# Patient Record
Sex: Male | Born: 1940 | Race: Black or African American | Hispanic: No | Smoking: Never smoker
Health system: Southern US, Community
[De-identification: ages and names within clinical notes are randomized; demographics above are authoritative.]

## PROBLEM LIST (undated history)

## (undated) DIAGNOSIS — M549 Dorsalgia, unspecified: Secondary | ICD-10-CM

## (undated) DIAGNOSIS — G8929 Other chronic pain: Secondary | ICD-10-CM

## (undated) DIAGNOSIS — E7800 Pure hypercholesterolemia, unspecified: Secondary | ICD-10-CM

---

## 2016-01-04 ENCOUNTER — Encounter (HOSPITAL_BASED_OUTPATIENT_CLINIC_OR_DEPARTMENT_OTHER): Payer: Self-pay | Admitting: Emergency Medicine

## 2016-01-04 ENCOUNTER — Emergency Department (HOSPITAL_BASED_OUTPATIENT_CLINIC_OR_DEPARTMENT_OTHER): Payer: Medicare HMO

## 2016-01-04 ENCOUNTER — Emergency Department (HOSPITAL_BASED_OUTPATIENT_CLINIC_OR_DEPARTMENT_OTHER)
Admission: EM | Admit: 2016-01-04 | Discharge: 2016-01-05 | Disposition: A | Discharge status: Home / Self Care | Payer: Medicare HMO | Attending: Emergency Medicine | Admitting: Emergency Medicine

## 2016-01-04 DIAGNOSIS — W293XXA Contact with powered garden and outdoor hand tools and machinery, initial encounter: Secondary | ICD-10-CM | POA: Diagnosis not present

## 2016-01-04 DIAGNOSIS — S61219A Laceration without foreign body of unspecified finger without damage to nail, initial encounter: Secondary | ICD-10-CM

## 2016-01-04 DIAGNOSIS — Y9389 Activity, other specified: Secondary | ICD-10-CM | POA: Insufficient documentation

## 2016-01-04 DIAGNOSIS — S61215A Laceration without foreign body of left ring finger without damage to nail, initial encounter: Secondary | ICD-10-CM | POA: Diagnosis not present

## 2016-01-04 DIAGNOSIS — Y999 Unspecified external cause status: Secondary | ICD-10-CM | POA: Insufficient documentation

## 2016-01-04 DIAGNOSIS — Y929 Unspecified place or not applicable: Secondary | ICD-10-CM | POA: Diagnosis not present

## 2016-01-04 DIAGNOSIS — T07XXXA Unspecified multiple injuries, initial encounter: Secondary | ICD-10-CM

## 2016-01-04 DIAGNOSIS — S61217A Laceration without foreign body of left little finger without damage to nail, initial encounter: Secondary | ICD-10-CM | POA: Diagnosis not present

## 2016-01-04 DIAGNOSIS — S61213A Laceration without foreign body of left middle finger without damage to nail, initial encounter: Secondary | ICD-10-CM | POA: Insufficient documentation

## 2016-01-04 HISTORY — DX: Pure hypercholesterolemia, unspecified: E78.00

## 2016-01-04 HISTORY — DX: Other chronic pain: G89.29

## 2016-01-04 HISTORY — DX: Dorsalgia, unspecified: M54.9

## 2016-01-04 MED ORDER — HYDROCODONE-ACETAMINOPHEN 5-325 MG PO TABS: 1.0000 | ORAL_TABLET | ORAL | Status: DC | PRN

## 2016-01-04 MED ORDER — TETANUS-DIPHTH-ACELL PERTUSSIS 5-2.5-18.5 LF-MCG/0.5 IM SUSP
0.5000 mL | Freq: Once | INTRAMUSCULAR | Status: AC
  Administered 2016-01-04: 0.5 mL via INTRAMUSCULAR
  Filled 2016-01-04: qty 0.5

## 2016-01-04 MED ORDER — LIDOCAINE HCL 1 % IJ SOLN
INTRAMUSCULAR | Status: AC
  Filled 2016-01-04: qty 20

## 2016-01-04 NOTE — ED Notes (Signed)
Patient states that he cut his left hand 2 middle fingers  - patient cut them with hedge clippers. Unable to control the bleeding in triage

## 2016-01-04 NOTE — ED Provider Notes (Signed)
LACERATION REPAIR Performed by: Carlyle DollyLAWYER,Conlee Sliter W Authorized by: Carlyle DollyLAWYER,Tabor Denham W Consent: Verbal consent obtained. Risks and benefits: risks, benefits and alternatives were discussed Consent given by: patient Patient identity confirmed: provided demographic data Prepped and Draped in normal sterile fashion Wound explored  Laceration Location: 3rd 4th and 5th digits  Laceration Length: 3 cm(3rd) 2.5 cm(4th) 0.75 cm (5th)  No Foreign Bodies seen or palpated  Anesthesia: local infiltration  Local anesthetic: lidocaine 2% w/o epinephrine  Anesthetic total: 7 ml  Irrigation method: syringe Amount of cleaning: standard  Skin closure: 5-0 Prolene  Number of sutures: 4 (5th) 6(4th) 8(3rd)  Technique: simple interrupted   Patient tolerance: Patient tolerated the procedure well with no immediate complications.   Charlestine NightChristopher Tifanie Gardiner, PA-C 01/04/16 [id number]  Arby BarretteMarcy Pfeiffer, MD 01/06/16 [phone]

## 2016-01-04 NOTE — ED Provider Notes (Signed)
CSN: [id number]     Arrival date & time 01/04/16  2003 History  By signing my name below, I, [facility], attest that this documentation has been prepared under the direction and in the presence of Arby Barrette, MD. Electronically Signed: Randell Patient, ED Scribe. 01/04/2016. 10:21 PM.   Chief Complaint  Patient presents with  . Extremity Laceration   The history is provided by the patient. No language interpreter was used.   HPI Comments: Curtis Wiley is a 75 y.o. male with an hx of hypercholesteremia who presents to the Emergency Department complaining of small, mildly painful, bleeding controlled lacerations to his left middle, ring, and little fingers that occurred shortly PTA. Pt states that he was using hedge clippers to trim his hedges when she accidentally lacerated is fingers followed immediately by pain and profuse bleeding. He notes that he takes tramadol occasionally for chronic back pain. Denies taking blood thinners medications. Denies any other symptoms currently.  Past Medical History  Diagnosis Date  . Hypercholesteremia   . Chronic back pain    History reviewed. No pertinent past surgical history. History reviewed. No pertinent family history. Social History  Substance Use Topics  . Smoking status: Never Smoker   . Smokeless tobacco: None  . Alcohol Use: No    Review of Systems  Skin: Positive for wound.  All other systems reviewed and are negative.     Allergies  Review of patient's allergies indicates no known allergies.  Home Medications   Prior to Admission medications   Medication Sig Start Date End Date Taking? Authorizing Provider  HYDROcodone-acetaminophen (NORCO/VICODIN) 5-325 MG tablet Take 1-2 tablets by mouth every 4 (four) hours as needed for moderate pain or severe pain. 01/04/16   Arby Barrette, MD  rosuvastatin (CRESTOR) 10 MG tablet Take 10 mg by mouth daily.   Yes Historical Provider, MD   BP 190/100 mmHg  Pulse 94   Temp(Src) 98.7 F (37.1 C) (Oral)  Resp 18  Ht  (1.753 m)  Wt 185 lb (83.915 kg)  BMI 27.31 kg/m2  SpO2 100% Physical Exam  Constitutional: He is oriented to person, place, and time. He appears well-developed and well-nourished. No distress.  HENT:  Head: Normocephalic and atraumatic.  Eyes: Conjunctivae are normal.  Neck: Normal range of motion.  Cardiovascular: Normal rate.   Pulmonary/Chest: Effort normal. No respiratory distress.  Abdominal: Soft. He exhibits no distension.  Musculoskeletal: Normal range of motion.  Neurological: He is alert and oriented to person, place, and time.  Skin: Skin is warm and dry. Laceration noted. No rash noted.  Lacerations to third, forth, and fifth finger pads of left hand. Third finger pad 1 cm slightly irregular laceration with noy tendon or joint involvement. Fourth digit 0.5 cm laceration to the pad without tendon or joint involvement. Fifth digit 0.25 cm laceration to pad. Normal ROM of left hand.  Psychiatric: He has a normal mood and affect. His behavior is normal.  Nursing note and vitals reviewed.   ED Course  Procedures   DIAGNOSTIC STUDIES: Oxygen Saturation is 100% on RA, normal by my interpretation.    COORDINATION OF CARE: 9:08 PM Will return to perform laceration repair. Discussed treatment plan with pt at bedside and pt agreed to plan.  Imaging Review Dg Finger Little Left  01/04/2016  CLINICAL DATA:  Pt cut Lt middle, ring and little fingers w/ electric hedge trimmer this evening, large anterior laceration lt distal phalanges of middle and ring fingers, very small laceration  lt little finger, distal phalanx, much bleeding is controlled now//a.c. EXAM: LEFT LITTLE FINGER 2+V COMPARISON:  None. FINDINGS: There is no evidence of fracture or dislocation. There is no evidence of arthropathy or other focal bone abnormality. Soft tissues are unremarkable. No radiodense foreign body. IMPRESSION: Negative. Electronically Signed    By: Corlis Leak  Hassell M.D.   On: 01/04/2016 22:13   Dg Finger Middle Left  01/04/2016  CLINICAL DATA:  Pt cut Lt middle, ring and little fingers w/ electric hedge trimmer this evening, large anterior laceration lt distal phalanges of middle and ring fingers, very small laceration lt little finger, distal phalanx, much bleeding is controlled now//a.c. EXAM: LEFT MIDDLE FINGER 2+V COMPARISON:  None. FINDINGS: There is no evidence of fracture or dislocation. There is no evidence of arthropathy or other focal bone abnormality. Soft tissues are unremarkable. Two punctate metallic foreign bodies project in the soft tissues at the ulnar side of the distal phalanx INDEX finger. IMPRESSION: Negative. Electronically Signed   By: Corlis Leak  Hassell M.D.   On: 01/04/2016 22:15   Dg Finger Ring Left  01/04/2016  CLINICAL DATA:  Pt cut Lt middle, ring and little fingers w/ electric hedge trimmer this evening, large anterior laceration lt distal phalanges of middle and ring fingers, very small laceration lt little finger, distal phalanx, much bleeding is controlled now//a.c. EXAM: LEFT RING FINGER 2+V COMPARISON:  None. FINDINGS: There is no evidence of fracture or dislocation. There is no evidence of arthropathy or other focal bone abnormality. Soft tissues are unremarkable. No radiodense foreign body. Overlying bandages. IMPRESSION: Negative. Electronically Signed   By: Corlis Leak  Hassell M.D.   On: 01/04/2016 22:14   I have personally reviewed and evaluated these images as part of my medical decision-making.  MDM   Final diagnoses:  Multiple lacerations  Finger laceration, initial encounter   Patient with fingertip lacerations due to hedge clippers. These have been cleaned and repaired as outlined in PA[employer] note. Age without other injury. Tetanus updated. Patient is alert and nontoxic. Patient has tramadol at home to take for pain. Patient reports that he is not hypertensive and normal blood pressures while control. He reports  his blood pressure was elevated due to anxiety and pain from his injury. He will continue to monitor this with his family physician.   Arby BarretteMarcy Analyah Mcconnon, MD 01/04/16 [phone]

## 2016-01-04 NOTE — Discharge Instructions (Signed)
Laceration Care, Adult  A laceration is a cut that goes through all layers of the skin. The cut also goes into the tissue that is right under the skin. Some cuts heal on their own. Others need to be closed with stitches (sutures), staples, skin adhesive strips, or wound glue. Taking care of your cut lowers your risk of infection and helps your cut to heal better.  HOW TO TAKE CARE OF YOUR CUT  For stitches or staples:  · Keep the wound clean and dry.  · If you were given a bandage (dressing), you should change it at least one time per day or as told by your doctor. You should also change it if it gets wet or dirty.  · Keep the wound completely dry for the first 24 hours or as told by your doctor. After that time, you may take a shower or a bath. However, make sure that the wound is not soaked in water until after the stitches or staples have been removed.  · Clean the wound one time each day or as told by your doctor:    Wash the wound with soap and water.    Rinse the wound with water until all of the soap comes off.    Pat the wound dry with a clean towel. Do not rub the wound.  · After you clean the wound, put a thin layer of antibiotic ointment on it as told by your doctor. This ointment:    Helps to prevent infection.    Keeps the bandage from sticking to the wound.  · Have your stitches or staples removed as told by your doctor.  If your doctor used skin adhesive strips:   · Keep the wound clean and dry.  · If you were given a bandage, you should change it at least one time per day or as told by your doctor. You should also change it if it gets dirty or wet.  · Do not get the skin adhesive strips wet. You can take a shower or a bath, but be careful to keep the wound dry.  · If the wound gets wet, pat it dry with a clean towel. Do not rub the wound.  · Skin adhesive strips fall off on their own. You can trim the strips as the wound heals. Do not remove any strips that are still stuck to the wound. They will  fall off after a while.  If your doctor used wound glue:  · Try to keep your wound dry, but you may briefly wet it in the shower or bath. Do not soak the wound in water, such as by swimming.  · After you take a shower or a bath, gently pat the wound dry with a clean towel. Do not rub the wound.  · Do not do any activities that will make you really sweaty until the skin glue has fallen off on its own.  · Do not apply liquid, cream, or ointment medicine to your wound while the skin glue is still on.  · If you were given a bandage, you should change it at least one time per day or as told by your doctor. You should also change it if it gets dirty or wet.  · If a bandage is placed over the wound, do not let the tape for the bandage touch the skin glue.  · Do not pick at the glue. The skin glue usually stays on for 5-10 days. Then, it   falls off of the skin.  General Instructions   · To help prevent scarring, make sure to cover your wound with sunscreen whenever you are outside after stitches are removed, after adhesive strips are removed, or when wound glue stays in place and the wound is healed. Make sure to wear a sunscreen of at least 30 SPF.  · Take over-the-counter and prescription medicines only as told by your doctor.  · If you were given antibiotic medicine or ointment, take or apply it as told by your doctor. Do not stop using the antibiotic even if your wound is getting better.  · Do not scratch or pick at the wound.  · Keep all follow-up visits as told by your doctor. This is important.  · Check your wound every day for signs of infection. Watch for:    Redness, swelling, or pain.    Fluid, blood, or pus.  · Raise (elevate) the injured area above the level of your heart while you are sitting or lying down, if possible.  GET HELP IF:  · You got a tetanus shot and you have any of these problems at the injection site:    Swelling.    Very bad pain.    Redness.    Bleeding.  · You have a fever.  · A wound that was  closed breaks open.  · You notice a bad smell coming from your wound or your bandage.  · You notice something coming out of the wound, such as wood or glass.  · Medicine does not help your pain.  · You have more redness, swelling, or pain at the site of your wound.  · You have fluid, blood, or pus coming from your wound.  · You notice a change in the color of your skin near your wound.  · You need to change the bandage often because fluid, blood, or pus is coming from the wound.  · You start to have a new rash.  · You start to have numbness around the wound.  GET HELP RIGHT AWAY IF:  · You have very bad swelling around the wound.  · Your pain suddenly gets worse and is very bad.  · You notice painful lumps near the wound or on skin that is anywhere on your body.  · You have a red streak going away from your wound.  · The wound is on your hand or foot and you cannot move a finger or toe like you usually can.  · The wound is on your hand or foot and you notice that your fingers or toes look pale or bluish.     This information is not intended to replace advice given to you by your health care provider. Make sure you discuss any questions you have with your health care provider.     Document Released: 12/20/2007 Document Revised: 11/17/2014 Document Reviewed: 06/29/2014  [employer]vier Interactive Patient Education ©2016 [employer]

## 2016-01-04 NOTE — ED Notes (Signed)
Lac to left middle, ring, and little finger on left hand w hedge clippers,  Bleeding controlled

## 2017-11-06 IMAGING — DX DG FINGER RING 2+V*L*
3 series · 3 of 3 positions shown · non-contrast
Comparison: None.

CLINICAL DATA: Pt cut Lt middle, ring and little fingers w/
electric [employer] this evening, large anterior laceration lt
distal phalanges of middle and ring fingers, very small laceration
lt little finger, distal phalanx, much bleeding is controlled
now//a.c.

EXAM:
LEFT RING FINGER 2+V

[finger ap]
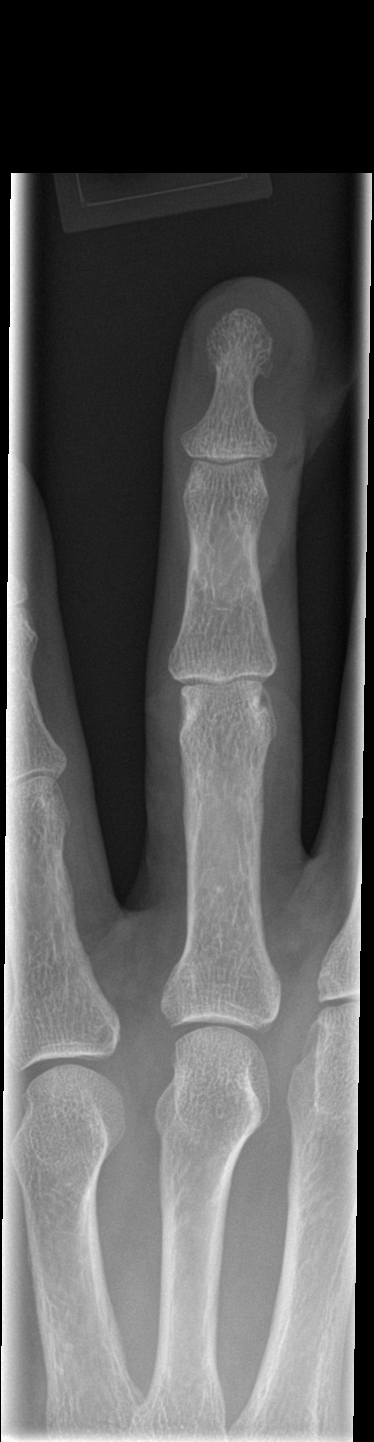

[finger obl]
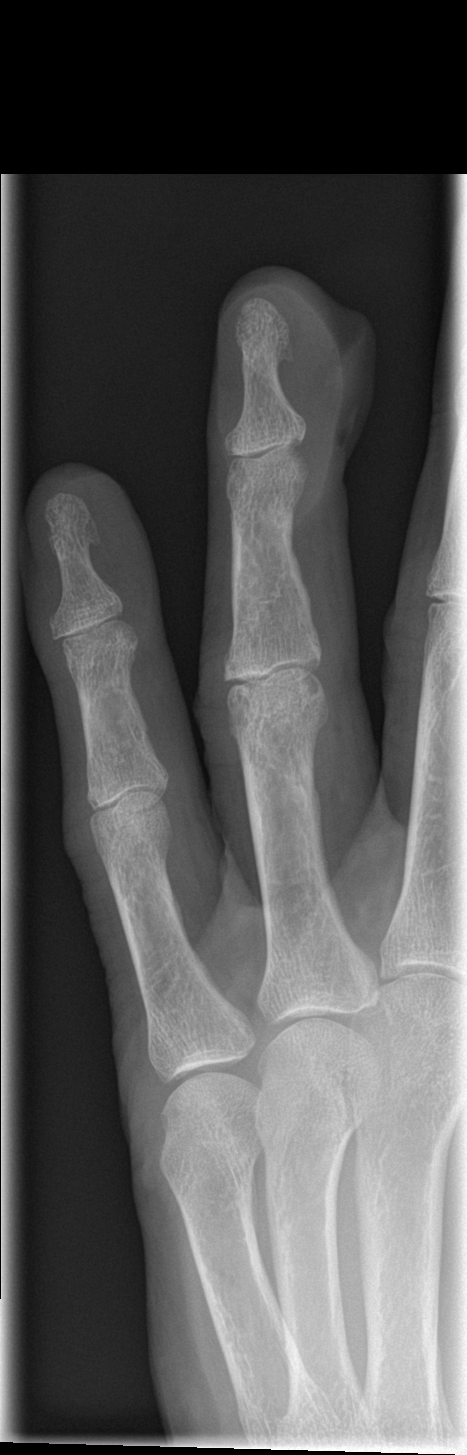

[finger lat]
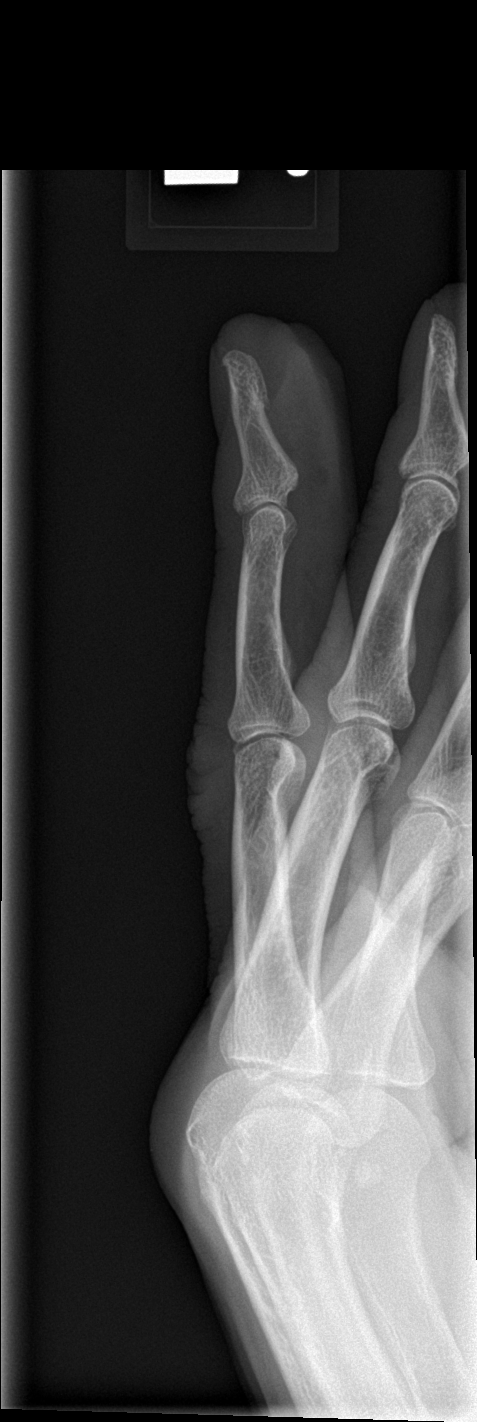

[3 of 3 positions shown; findings below may reference images not displayed]

FINDINGS: There is no evidence of fracture or dislocation. There is no
evidence of arthropathy or other focal bone abnormality. Soft
tissues are unremarkable. No radiodense foreign body. Overlying
bandages.
IMPRESSION: Negative.
